# Patient Record
Sex: Female | Born: 2000
Health system: Southern US, Community
[De-identification: ages and names within clinical notes are randomized; demographics above are authoritative.]

---
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
## 2019-08-08 IMAGING — CR DG CHEST 1V
1 series · 1 of 1 positions shown · non-contrast
Comparison: None.

CLINICAL DATA: 17-year-old female with possible abnormal TB skin
test. Initial encounter.

EXAM:
CHEST 1 VIEW

[dg chest 1 view]
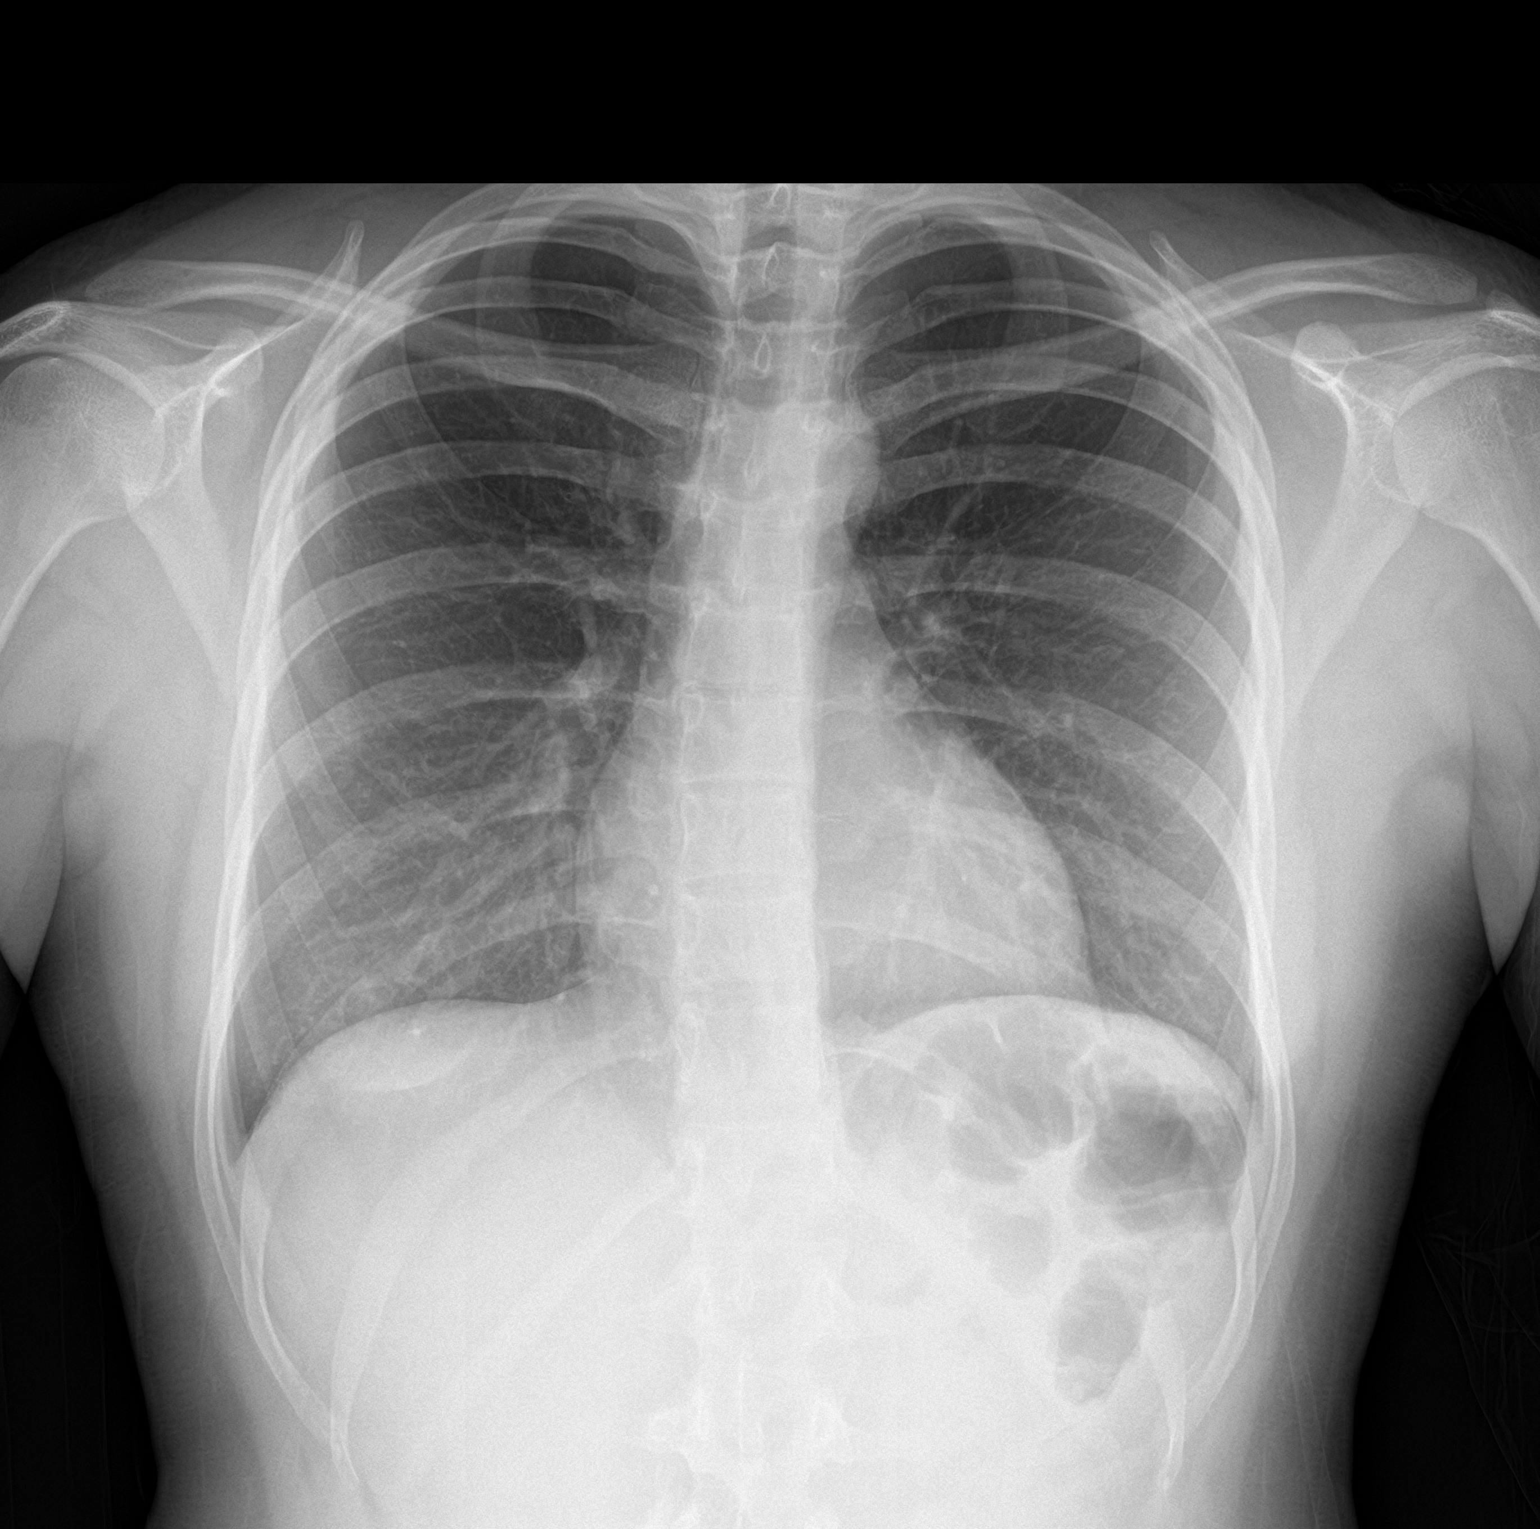

[1 of 1 positions shown; findings below may reference images not displayed]

FINDINGS: Clear lungs.

Heart size within normal limits.

Mild curvature thoracic spine convex right.
IMPRESSION: Clear lungs.

Mild curvature thoracic spine convex right.

## 2020-12-05 ENCOUNTER — Other Ambulatory Visit: Payer: Self-pay

## 2020-12-05 ENCOUNTER — Ambulatory Visit
Admission: EM | Admit: 2020-12-05 | Discharge: 2020-12-05 | Disposition: A | Discharge status: Home / Self Care | Payer: No Typology Code available for payment source | Attending: Family Medicine | Admitting: Family Medicine

## 2020-12-05 DIAGNOSIS — N12 Tubulo-interstitial nephritis, not specified as acute or chronic: Secondary | ICD-10-CM | POA: Insufficient documentation

## 2020-12-05 LAB — URINALYSIS, COMPLETE (UACMP) WITH MICROSCOPIC
Bilirubin Urine: NEGATIVE
Glucose, UA: NEGATIVE mg/dL
Ketones, ur: NEGATIVE mg/dL
Nitrite: NEGATIVE
Protein, ur: NEGATIVE mg/dL
Specific Gravity, Urine: 1.01 (ref 1.005–1.030)
WBC, UA: >50 WBC/hpf (ref 0–5)
pH: 7 (ref 5.0–8.0)

## 2020-12-05 MED ORDER — CEFDINIR 300 MG PO CAPS: 300.0000 mg | ORAL_CAPSULE | Freq: Two times a day (BID) | ORAL | 0 refills | Status: AC

## 2020-12-05 NOTE — ED Provider Notes (Signed)
[facility]    CSN: [id number] Arrival date & time: 12/05/20  0803      History   Chief Complaint Chief Complaint  Patient presents with   Back Pain    Lower/mid right    HPI 20 year old female presents with complaints of "kidney pain".  Patient states that this started on Wednesday of last week.  She states that she initially had left-sided pain but this is now resolved and she is now experiencing pain on the right side.  Patient denies current abdominal pain.  No fever.  No nausea or vomiting.  She reports urinary frequency.  Denies dysuria.  She states that she has been drinking a lot of red bulls and thinks this is contributing.  Her pain is 6/10 in severity.  No relieving factors.  She states that her pain is constant.  No other complaints at this time.  Home Medications    Prior to Admission medications   Medication Sig Start Date End Date Taking? Authorizing Provider  cefdinir (OMNICEF) 300 MG capsule Take 1 capsule (300 mg total) by mouth 2 (two) times daily. 12/05/20  Yes Tommie Sams, DO   Social History Social History   Tobacco Use   Smoking status: Never   Smokeless tobacco: Never  Vaping Use   Vaping Use: Never used  Substance Use Topics   Alcohol use: Not Currently   Drug use: Never     Allergies   Patient has no known allergies.   Review of Systems Review of Systems  Constitutional: Negative.   Gastrointestinal: Negative.   Genitourinary:  Positive for flank pain and frequency. Negative for dysuria.    Physical Exam Triage Vital Signs ED Triage Vitals  Enc Vitals Group     BP 12/05/20 0824 115/75     Pulse Rate 12/05/20 0824 60     Resp 12/05/20 0824 18     Temp 12/05/20 0824 98.4 F (36.9 C)     Temp Source 12/05/20 0824 Oral     SpO2 12/05/20 0824 100 %     Weight 12/05/20 0821 130 lb (59 kg)     Height 12/05/20 0821 5\' 3"  (1.6 m)     Head Circumference --      Peak Flow --      Pain Score 12/05/20 0821 6     Pain Loc  --      Pain Edu? --      Excl. in GC? --    Updated Vital Signs BP 115/75 (BP Location: Left Arm)   Pulse 60   Temp 98.4 F (36.9 C) (Oral)   Resp 18   Ht 5\' 3"  (1.6 m)   Wt 59 kg   LMP 11/18/2020   SpO2 100%   BMI 23.03 kg/m   Visual Acuity Right Eye Distance:   Left Eye Distance:   Bilateral Distance:    Right Eye Near:   Left Eye Near:    Bilateral Near:     Physical Exam Vitals and nursing note reviewed.  Constitutional:      General: She is not in acute distress.    Appearance: Normal appearance. She is not ill-appearing.  HENT:     Head: Normocephalic and atraumatic.  Eyes:     General:        Right eye: No discharge.        Left eye: No discharge.     Conjunctiva/sclera: Conjunctivae normal.  Cardiovascular:     Rate and Rhythm: Normal  rate and regular rhythm.     Heart sounds: No murmur heard. Pulmonary:     Effort: Pulmonary effort is normal.     Breath sounds: Normal breath sounds. No wheezing, rhonchi or rales.  Abdominal:     General: There is no distension.     Palpations: Abdomen is soft.     Tenderness: no abdominal tenderness There is no right CVA tenderness or left CVA tenderness.  Neurological:     Mental Status: She is alert.  Psychiatric:        Mood and Affect: Mood normal.        Behavior: Behavior normal.     UC Treatments / Results  Labs (all labs ordered are listed, but only abnormal results are displayed) Labs Reviewed  URINALYSIS, COMPLETE (UACMP) WITH MICROSCOPIC - Abnormal; Notable for the following components:      Result Value   APPearance HAZY (*)    Hgb urine dipstick TRACE (*)    Leukocytes,Ua SMALL (*)    Bacteria, UA MANY (*)    All other components within normal limits  URINE CULTURE    EKG   Radiology No results found.  Procedures Procedures (including critical care time)  Medications Ordered in UC Medications - No data to display  Initial Impression / Assessment and Plan / UC Course  I have  reviewed the triage vital signs and the nursing notes.  Pertinent labs & imaging results that were available during my care of the patient were reviewed by me and considered in my medical decision making (see chart for details).    20 year old female presents with flank pain and urinary frequency.  Urine microscopy revealed greater than 50 white blood cells with white blood cell clumps.  Sending culture.  Suspect pyelonephritis.  Placing on Omnicef.  Final Clinical Impressions(s) / UC Diagnoses   Final diagnoses:  Pyelonephritis   Discharge Instructions   None    ED Prescriptions     Medication Sig Dispense Auth. Provider   cefdinir (OMNICEF) 300 MG capsule Take 1 capsule (300 mg total) by mouth 2 (two) times daily. 14 capsule Everlene Other G, DO      PDMP not reviewed this encounter.   Tommie Sams, [place] 12/05/20 [phone]

## 2020-12-05 NOTE — ED Triage Notes (Signed)
Pt c/o back pain, started on the left and is now on the right, for about the past week. Pt is concerned it is her kidneys. Pt denies any urinary symptoms. Pt also reports some sharp lower abd pain yesterday, this has resolved.

## 2020-12-08 LAB — URINE CULTURE: Culture: >=100000 — AB

## 2020-12-10 ENCOUNTER — Telehealth: Payer: Self-pay | Admitting: Medical Oncology

## 2020-12-10 MED ORDER — SULFAMETHOXAZOLE-TRIMETHOPRIM 800-160 MG PO TABS: 1.0000 | ORAL_TABLET | Freq: Two times a day (BID) | ORAL | 0 refills | Status: AC

## 2020-12-10 NOTE — Telephone Encounter (Signed)
See phone note
